# Patient Record
Sex: Female | Born: 1960 | Race: Black or African American | Hispanic: No | Marital: Married | Smoking: Never smoker
Health system: Southern US, Community
[De-identification: ages and names within clinical notes are randomized; demographics above are authoritative.]

## PROBLEM LIST (undated history)

## (undated) DIAGNOSIS — T7840XA Allergy, unspecified, initial encounter: Secondary | ICD-10-CM

## (undated) HISTORY — DX: Allergy, unspecified, initial encounter: T78.40XA

---

## 1998-05-13 ENCOUNTER — Ambulatory Visit (HOSPITAL_COMMUNITY): Admission: RE | Admit: 1998-05-13 | Discharge: 1998-05-13 | Payer: Self-pay | Admitting: *Deleted

## 1999-12-10 ENCOUNTER — Other Ambulatory Visit: Admission: RE | Admit: 1999-12-10 | Discharge: 1999-12-10 | Payer: Self-pay | Admitting: Family Medicine

## 1999-12-24 ENCOUNTER — Encounter: Payer: Self-pay | Admitting: Family Medicine

## 1999-12-24 ENCOUNTER — Ambulatory Visit (HOSPITAL_COMMUNITY): Admission: RE | Admit: 1999-12-24 | Discharge: 1999-12-24 | Payer: Self-pay | Admitting: *Deleted

## 2001-05-17 ENCOUNTER — Other Ambulatory Visit: Admission: RE | Admit: 2001-05-17 | Discharge: 2001-05-17 | Payer: Self-pay | Admitting: Family Medicine

## 2002-06-28 ENCOUNTER — Encounter: Admission: RE | Admit: 2002-06-28 | Discharge: 2002-06-28 | Payer: Self-pay | Admitting: Family Medicine

## 2002-06-28 ENCOUNTER — Encounter: Payer: Self-pay | Admitting: Family Medicine

## 2002-07-11 ENCOUNTER — Other Ambulatory Visit: Admission: RE | Admit: 2002-07-11 | Discharge: 2002-07-11 | Payer: Self-pay | Admitting: Family Medicine

## 2004-01-06 ENCOUNTER — Other Ambulatory Visit: Admission: RE | Admit: 2004-01-06 | Discharge: 2004-01-06 | Payer: Self-pay | Admitting: *Deleted

## 2004-11-16 ENCOUNTER — Encounter: Admission: RE | Admit: 2004-11-16 | Discharge: 2004-11-16 | Payer: Self-pay | Admitting: Family Medicine

## 2005-02-25 ENCOUNTER — Other Ambulatory Visit: Admission: RE | Admit: 2005-02-25 | Discharge: 2005-02-25 | Payer: Self-pay | Admitting: Obstetrics and Gynecology

## 2006-11-08 ENCOUNTER — Ambulatory Visit (HOSPITAL_COMMUNITY): Admission: RE | Admit: 2006-11-08 | Discharge: 2006-11-08 | Payer: Self-pay | Admitting: Obstetrics and Gynecology

## 2006-12-03 ENCOUNTER — Emergency Department (HOSPITAL_COMMUNITY): Admission: EM | Admit: 2006-12-03 | Discharge: 2006-12-04 | Payer: Self-pay | Admitting: Emergency Medicine

## 2013-01-15 ENCOUNTER — Emergency Department: Payer: Self-pay | Admitting: Emergency Medicine

## 2014-09-24 ENCOUNTER — Ambulatory Visit
Admission: RE | Admit: 2014-09-24 | Discharge: 2014-09-24 | Disposition: A | Discharge status: Home / Self Care | Payer: BC Managed Care – PPO | Source: Ambulatory Visit | Attending: Physician Assistant | Admitting: Physician Assistant

## 2014-09-24 ENCOUNTER — Other Ambulatory Visit: Payer: Self-pay | Admitting: Physician Assistant

## 2014-09-24 DIAGNOSIS — R05 Cough: Secondary | ICD-10-CM

## 2014-09-24 DIAGNOSIS — R059 Cough, unspecified: Secondary | ICD-10-CM

## 2015-05-18 ENCOUNTER — Other Ambulatory Visit: Payer: Self-pay | Admitting: Obstetrics and Gynecology

## 2015-05-19 LAB — CYTOLOGY - PAP

## 2015-12-14 ENCOUNTER — Ambulatory Visit: Payer: BLUE CROSS/BLUE SHIELD | Attending: Gynecologic Oncology | Admitting: Gynecologic Oncology

## 2015-12-14 ENCOUNTER — Encounter: Payer: Self-pay | Admitting: Gynecologic Oncology

## 2015-12-14 VITALS — BP 123/64 | HR 79 | Temp 98.9°F | Resp 18 | Ht 66.0 in | Wt 200.4 lb

## 2015-12-14 DIAGNOSIS — Z6832 Body mass index (BMI) 32.0-32.9, adult: Secondary | ICD-10-CM | POA: Insufficient documentation

## 2015-12-14 DIAGNOSIS — Z8041 Family history of malignant neoplasm of ovary: Secondary | ICD-10-CM

## 2015-12-14 DIAGNOSIS — E669 Obesity, unspecified: Secondary | ICD-10-CM | POA: Insufficient documentation

## 2015-12-14 DIAGNOSIS — R971 Elevated cancer antigen 125 [CA 125]: Secondary | ICD-10-CM | POA: Diagnosis present

## 2015-12-14 DIAGNOSIS — D259 Leiomyoma of uterus, unspecified: Secondary | ICD-10-CM

## 2015-12-14 NOTE — Progress Notes (Signed)
Consult Note: Gyn-Onc  Consult was requested by Dr. Radene Knee for the evaluation of Emma Mccormick 55 y.o. female  CC:  Chief Complaint  Patient presents with  . elevated CA 125    Assessment/Plan:  Ms. Emma Mccormick  is a 55 y.o.  year old with a family history (maternal aunt) of ovarian cancer, negative BRCA testing and a slightly elevated CA 125.  I discussed with the patient that given her normal US findings, I am reassured this is unlikely to be malignancy. It may be slightly elevated secondary to fibroids.  I am recommending she have her CA 125 rechecked in the same lab approximately 1 month after the last assessment. If there is a consistent upward trend, we would consider evaluating with laparoscopy. However, this patient has additional surgical risks (obesity, BMI 32, prior surgeries) and I discussed the risks of surgical castration prior to age 8 (increased all-cause mortality). Therefore, we should exhaust a more conservative work-up prior to pursuing surgery.  The patient feels comfortable with this plan as she states she "doesnt want surgery".  I discussed that she is at average risk for ovarian cancer (1 in 17) and therefore, once her CA 125 level is established to be either stable or normal, she should not be a recipient of ongoing screening US or CA 125 assessments.  HPI: Emma Mccormick is a 55 year old P2 who is seen in consultation at the request of Dr Radene Knee for an elevated CA 125 tumor marker. This is in the setting of fibroid uterus and a family history (maternal aunt) with ovarian cancer. She has no history for breast cancer. Her BRCA testing from January 2017 was negative for deleterious mutations.  Due to her concern a CA 125 level was drawn on 11/19/15 and was slightly elevated at 38U/mL. A transvaginal US was ordered the same day which revealed a 12.6x7.6x9.4cm uterus. The right ovary was grossly normal in size with a sub-cm simple cyst. The left ovary was not seen  secondary to fibroids and body habitus. The uterus contained several intramural fibroids with the largest measuring 7cm.  She has had 2 cesarean sections.  Current Meds:  No outpatient encounter prescriptions on file as of 12/14/2015.   No facility-administered encounter medications on file as of 12/14/2015.    Allergy:  Allergies  Allergen Reactions  . Sulfa Antibiotics Hives    Social Hx:   Social History   Social History  . Marital Status: Married    Spouse Name: N/A  . Number of Children: N/A  . Years of Education: N/A   Occupational History  . Not on file.   Social History Main Topics  . Smoking status: Never Smoker   . Smokeless tobacco: Not on file  . Alcohol Use: No  . Drug Use: No  . Sexual Activity: Not on file   Other Topics Concern  . Not on file   Social History Narrative  . No narrative on file    Past Surgical Hx:  Past Surgical History  Procedure Laterality Date  . Cesarean section  1981 and 1994    Past Medical Hx:  Past Medical History  Diagnosis Date  . Allergy     Past Gynecological History:  C/s x 2 No LMP recorded.  Family Hx:  Family History  Problem Relation Age of Onset  . Diabetes Mother   . Hypertension Mother   . Cancer Sister   . Cancer Brother   . Cancer Maternal Aunt  Review of Systems:  Constitutional  Feels well,    ENT Normal appearing ears and nares bilaterally Skin/Breast  No rash, sores, jaundice, itching, dryness Cardiovascular  No chest pain, shortness of breath, or edema  Pulmonary  No cough or wheeze.  Gastro Intestinal  No nausea, vomitting, or diarrhoea. No bright red blood per rectum, no abdominal pain, change in bowel movement, or constipation.  Genito Urinary  No frequency, urgency, dysuria, + vaginal spotting Musculo Skeletal  No myalgia, arthralgia, joint swelling or pain  Neurologic  No weakness, numbness, change in gait,  Psychology  No depression, anxiety, insomnia.   Vitals:   Blood pressure 123/64, pulse 79, temperature 98.9 F (37.2 C), temperature source Oral, resp. rate 18, height 5' 6"  (1.676 m), weight 200 lb 6.4 oz (90.901 kg), SpO2 100 %.  Physical Exam: WD in NAD Neck  Supple NROM, without any enlargements.  Lymph Node Survey No cervical supraclavicular or inguinal adenopathy Cardiovascular  Pulse normal rate, regularity and rhythm. S1 and S2 normal.  Lungs  Clear to auscultation bilateraly, without wheezes/crackles/rhonchi. Good air movement.  Skin  No rash/lesions/breakdown  Psychiatry  Alert and oriented to person, place, and time  Abdomen  Normoactive bowel sounds, abdomen soft, non-tender and obese without evidence of hernia.  Back No CVA tenderness Genito Urinary  Vulva/vagina: Normal external female genitalia.  No lesions. No discharge or bleeding.  Bladder/urethra:  No lesions or masses, well supported bladder  Vagina: normal  Cervix: Normal appearing, no lesions.  Uterus: Bulky, 12cm. mobile, no parametrial involvement or nodularity.  Adnexa: no palpable masses. Rectal  deferred  Extremities  No bilateral cyanosis, clubbing or edema.   Donaciano Eva, MD  12/14/2015, 1:07 PM

## 2015-12-14 NOTE — Patient Instructions (Signed)
Plan to have a repeat CA 125 draw with Dr. Sherran Needs office.  Please call for any questions or concerns.

## 2016-01-11 ENCOUNTER — Encounter: Payer: Self-pay | Admitting: Genetic Counselor

## 2016-01-14 ENCOUNTER — Telehealth: Payer: Self-pay | Admitting: Gynecologic Oncology

## 2016-01-14 NOTE — Telephone Encounter (Signed)
Left message to call back.  Her CA 125 is stable (37 - had been 38).  No evidence for occult malignancy.  Mild elevation in CA 125 likely secondary to fibroids.  Plan is for no intervention (including no surgery). She does not require any more screening checks of CA 125 or ultrasounds.  Donaciano Eva, MD

## 2016-02-08 ENCOUNTER — Encounter: Payer: BLUE CROSS/BLUE SHIELD | Admitting: Genetic Counselor

## 2016-02-08 ENCOUNTER — Other Ambulatory Visit: Payer: BLUE CROSS/BLUE SHIELD

## 2016-02-25 DIAGNOSIS — M25511 Pain in right shoulder: Secondary | ICD-10-CM | POA: Diagnosis not present

## 2016-02-26 ENCOUNTER — Other Ambulatory Visit: Payer: Self-pay | Admitting: Family Medicine

## 2016-02-26 ENCOUNTER — Ambulatory Visit
Admission: RE | Admit: 2016-02-26 | Discharge: 2016-02-26 | Disposition: A | Discharge status: Home / Self Care | Payer: BLUE CROSS/BLUE SHIELD | Source: Ambulatory Visit | Attending: Family Medicine | Admitting: Family Medicine

## 2016-02-26 DIAGNOSIS — R52 Pain, unspecified: Secondary | ICD-10-CM

## 2016-02-26 DIAGNOSIS — M25511 Pain in right shoulder: Secondary | ICD-10-CM | POA: Diagnosis not present

## 2016-06-08 DIAGNOSIS — Z6838 Body mass index (BMI) 38.0-38.9, adult: Secondary | ICD-10-CM | POA: Diagnosis not present

## 2016-06-08 DIAGNOSIS — Z1329 Encounter for screening for other suspected endocrine disorder: Secondary | ICD-10-CM | POA: Diagnosis not present

## 2016-06-08 DIAGNOSIS — Z780 Asymptomatic menopausal state: Secondary | ICD-10-CM | POA: Diagnosis not present

## 2016-06-08 DIAGNOSIS — Z13220 Encounter for screening for lipoid disorders: Secondary | ICD-10-CM | POA: Diagnosis not present

## 2016-06-08 DIAGNOSIS — Z01419 Encounter for gynecological examination (general) (routine) without abnormal findings: Secondary | ICD-10-CM | POA: Diagnosis not present

## 2016-06-08 DIAGNOSIS — Z13228 Encounter for screening for other metabolic disorders: Secondary | ICD-10-CM | POA: Diagnosis not present

## 2016-06-08 DIAGNOSIS — Z1231 Encounter for screening mammogram for malignant neoplasm of breast: Secondary | ICD-10-CM | POA: Diagnosis not present

## 2016-07-20 DIAGNOSIS — E559 Vitamin D deficiency, unspecified: Secondary | ICD-10-CM | POA: Diagnosis not present

## 2016-11-11 DIAGNOSIS — J069 Acute upper respiratory infection, unspecified: Secondary | ICD-10-CM | POA: Diagnosis not present

## 2016-11-15 DIAGNOSIS — J069 Acute upper respiratory infection, unspecified: Secondary | ICD-10-CM | POA: Diagnosis not present

## 2016-12-12 DIAGNOSIS — H43811 Vitreous degeneration, right eye: Secondary | ICD-10-CM | POA: Diagnosis not present

## 2016-12-12 DIAGNOSIS — H43391 Other vitreous opacities, right eye: Secondary | ICD-10-CM | POA: Diagnosis not present

## 2016-12-14 DIAGNOSIS — R8290 Unspecified abnormal findings in urine: Secondary | ICD-10-CM | POA: Diagnosis not present

## 2016-12-30 IMAGING — CR DG SHOULDER 2+V*R*
3 series · 3 of 3 positions shown · non-contrast
Comparison: None.

CLINICAL DATA: Right shoulder pain for 6 months

EXAM:
RIGHT SHOULDER - 2+ VIEW

[w shoulder ap internal righ *]
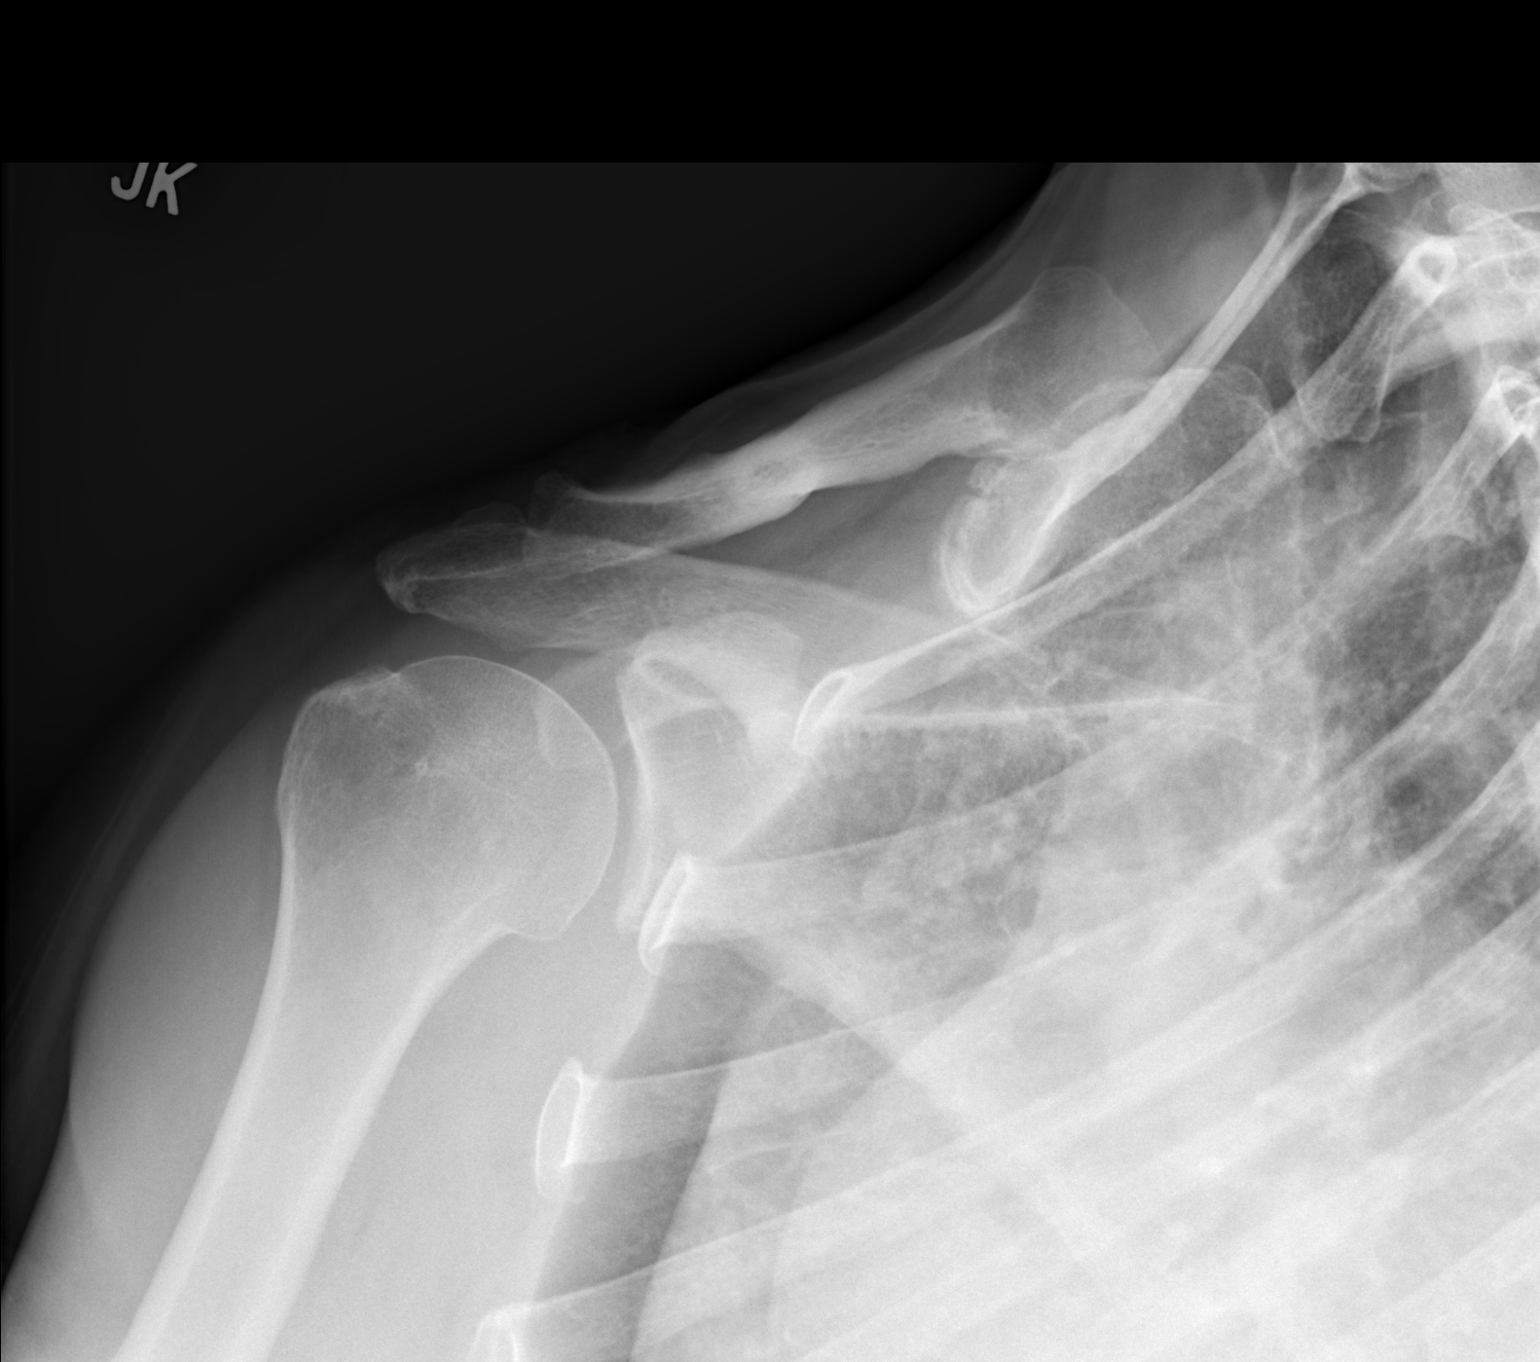

[w shoulder ap external righ]
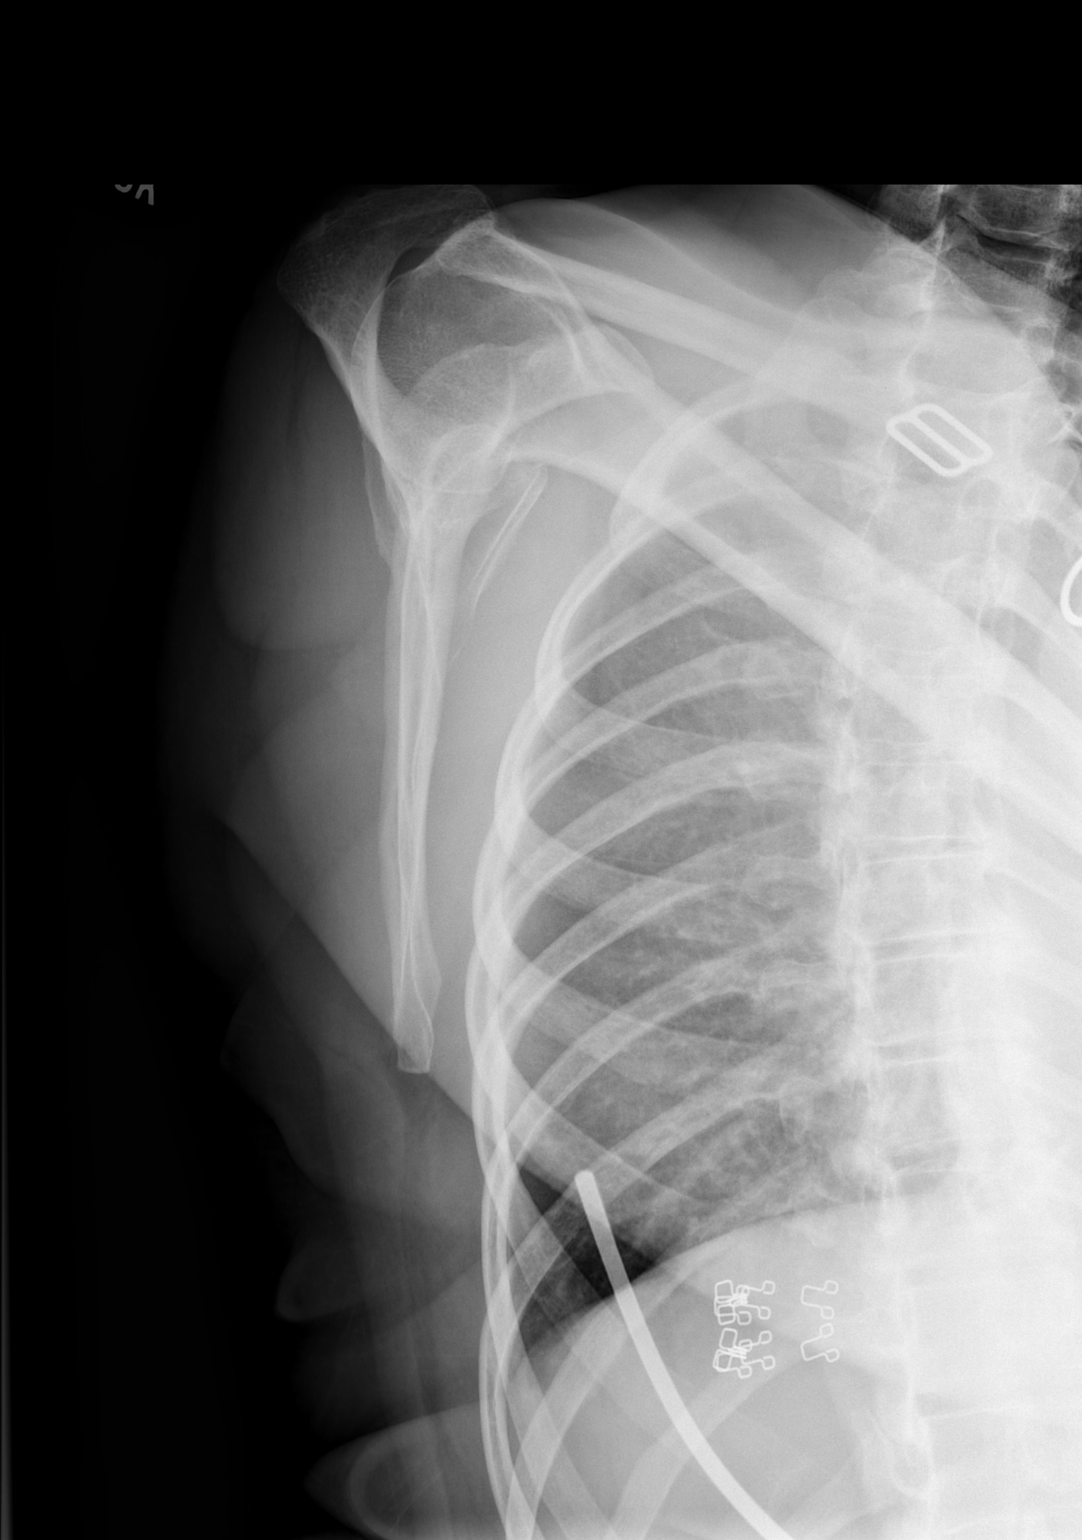

[w shoulder y view right]
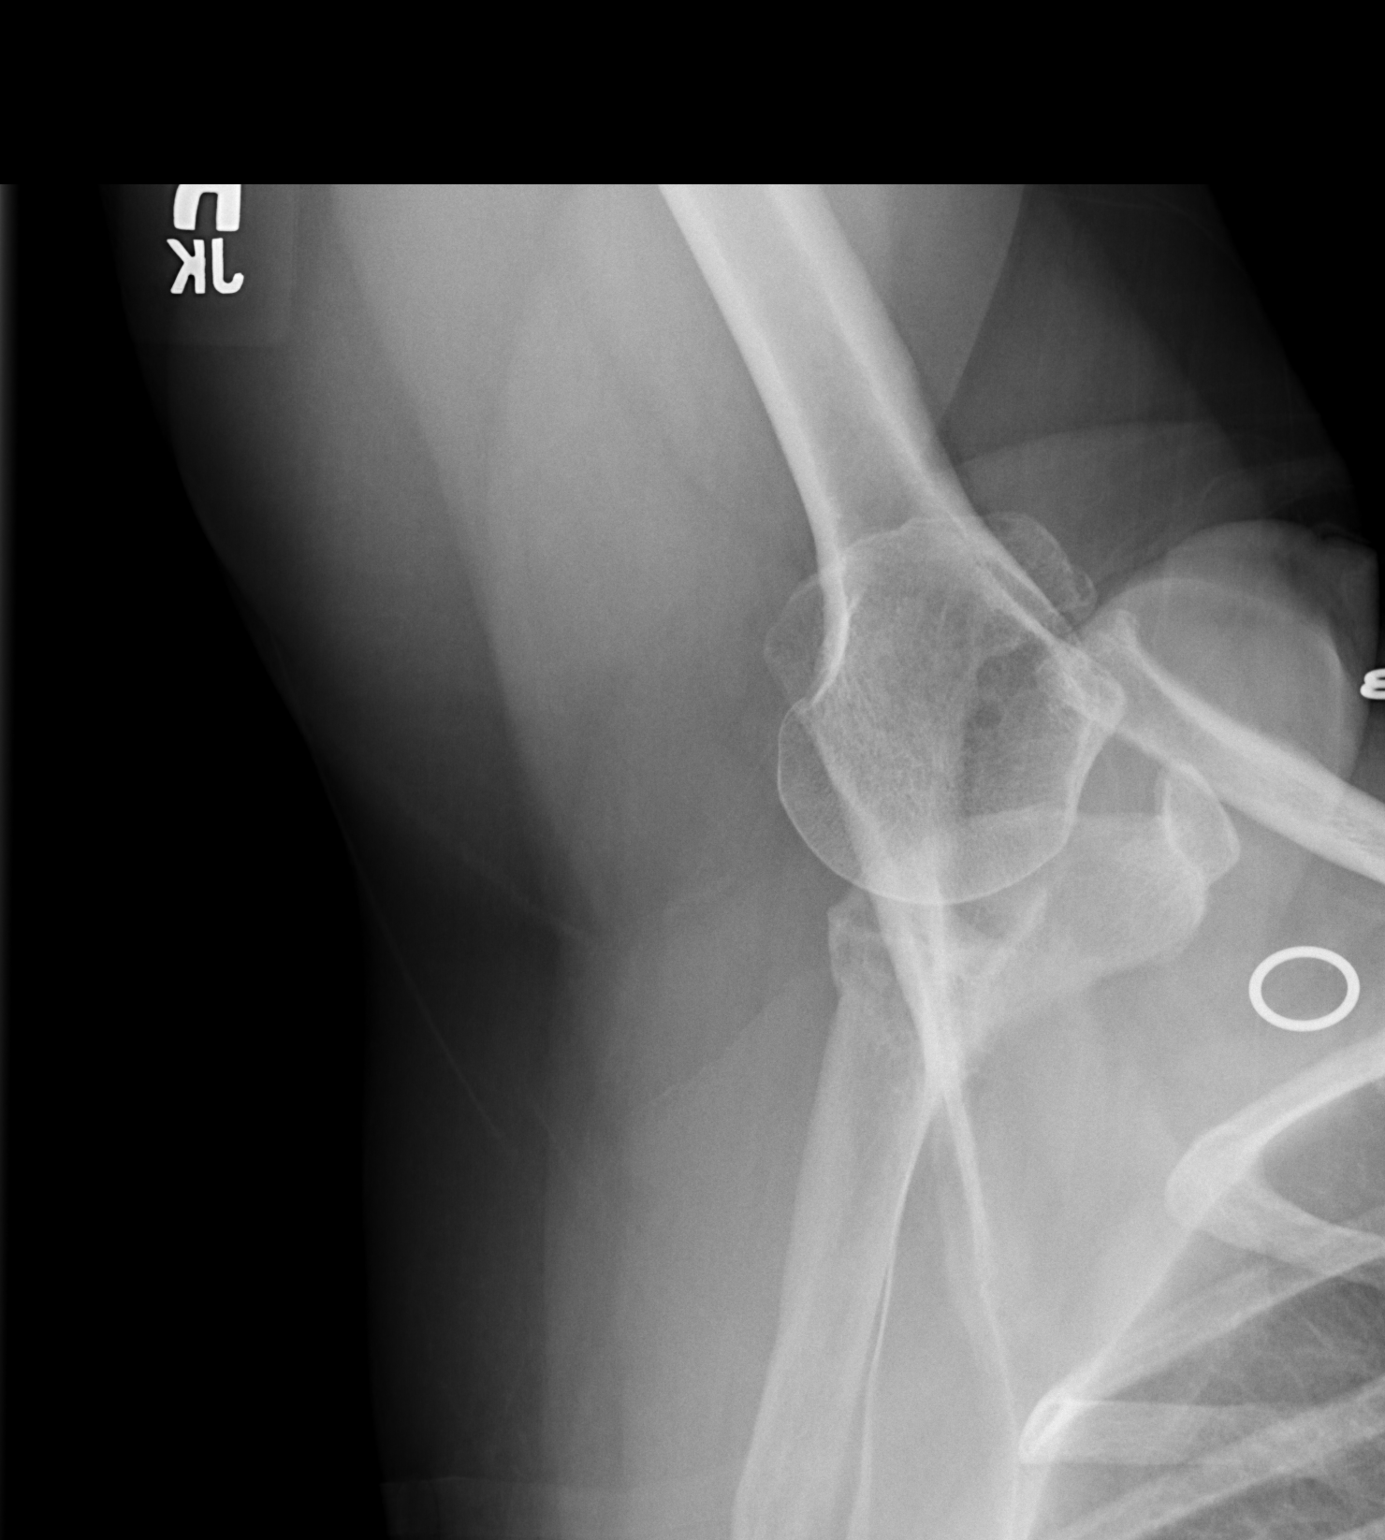

[3 of 3 positions shown; findings below may reference images not displayed]

FINDINGS: Anatomic alignment.  No fracture or dislocation.
IMPRESSION: No acute bony pathology.

## 2017-02-14 DIAGNOSIS — H43811 Vitreous degeneration, right eye: Secondary | ICD-10-CM | POA: Diagnosis not present

## 2017-02-14 DIAGNOSIS — H43391 Other vitreous opacities, right eye: Secondary | ICD-10-CM | POA: Diagnosis not present

## 2017-04-17 DIAGNOSIS — M25572 Pain in left ankle and joints of left foot: Secondary | ICD-10-CM | POA: Diagnosis not present

## 2017-04-28 DIAGNOSIS — S93409A Sprain of unspecified ligament of unspecified ankle, initial encounter: Secondary | ICD-10-CM | POA: Diagnosis not present

## 2017-05-26 DIAGNOSIS — M25572 Pain in left ankle and joints of left foot: Secondary | ICD-10-CM | POA: Diagnosis not present

## 2017-06-12 DIAGNOSIS — Z6836 Body mass index (BMI) 36.0-36.9, adult: Secondary | ICD-10-CM | POA: Diagnosis not present

## 2017-06-12 DIAGNOSIS — Z01419 Encounter for gynecological examination (general) (routine) without abnormal findings: Secondary | ICD-10-CM | POA: Diagnosis not present

## 2017-06-12 DIAGNOSIS — E559 Vitamin D deficiency, unspecified: Secondary | ICD-10-CM | POA: Diagnosis not present

## 2017-06-12 DIAGNOSIS — Z1231 Encounter for screening mammogram for malignant neoplasm of breast: Secondary | ICD-10-CM | POA: Diagnosis not present

## 2017-06-16 DIAGNOSIS — S93402A Sprain of unspecified ligament of left ankle, initial encounter: Secondary | ICD-10-CM | POA: Diagnosis not present

## 2017-07-25 DIAGNOSIS — E559 Vitamin D deficiency, unspecified: Secondary | ICD-10-CM | POA: Diagnosis not present

## 2017-09-07 DIAGNOSIS — G4750 Parasomnia, unspecified: Secondary | ICD-10-CM | POA: Diagnosis not present

## 2017-09-07 DIAGNOSIS — G2581 Restless legs syndrome: Secondary | ICD-10-CM | POA: Diagnosis not present

## 2017-09-07 DIAGNOSIS — G4710 Hypersomnia, unspecified: Secondary | ICD-10-CM | POA: Diagnosis not present

## 2017-09-07 DIAGNOSIS — R0602 Shortness of breath: Secondary | ICD-10-CM | POA: Diagnosis not present

## 2017-09-15 DIAGNOSIS — G4730 Sleep apnea, unspecified: Secondary | ICD-10-CM | POA: Diagnosis not present

## 2017-09-21 DIAGNOSIS — G2581 Restless legs syndrome: Secondary | ICD-10-CM | POA: Diagnosis not present

## 2017-09-21 DIAGNOSIS — R0683 Snoring: Secondary | ICD-10-CM | POA: Diagnosis not present

## 2017-11-08 DIAGNOSIS — M546 Pain in thoracic spine: Secondary | ICD-10-CM | POA: Diagnosis not present

## 2017-11-28 DIAGNOSIS — N898 Other specified noninflammatory disorders of vagina: Secondary | ICD-10-CM | POA: Diagnosis not present

## 2018-06-14 DIAGNOSIS — E559 Vitamin D deficiency, unspecified: Secondary | ICD-10-CM | POA: Diagnosis not present

## 2018-06-14 DIAGNOSIS — Z8041 Family history of malignant neoplasm of ovary: Secondary | ICD-10-CM | POA: Diagnosis not present

## 2018-06-14 DIAGNOSIS — D259 Leiomyoma of uterus, unspecified: Secondary | ICD-10-CM | POA: Diagnosis not present

## 2018-06-14 DIAGNOSIS — R7982 Elevated C-reactive protein (CRP): Secondary | ICD-10-CM | POA: Diagnosis not present

## 2018-06-14 DIAGNOSIS — Z1231 Encounter for screening mammogram for malignant neoplasm of breast: Secondary | ICD-10-CM | POA: Diagnosis not present

## 2018-06-14 DIAGNOSIS — Z6833 Body mass index (BMI) 33.0-33.9, adult: Secondary | ICD-10-CM | POA: Diagnosis not present

## 2018-06-14 DIAGNOSIS — Z01419 Encounter for gynecological examination (general) (routine) without abnormal findings: Secondary | ICD-10-CM | POA: Diagnosis not present

## 2018-06-14 DIAGNOSIS — N951 Menopausal and female climacteric states: Secondary | ICD-10-CM | POA: Diagnosis not present

## 2018-06-14 DIAGNOSIS — R5383 Other fatigue: Secondary | ICD-10-CM | POA: Diagnosis not present

## 2018-06-20 DIAGNOSIS — R971 Elevated cancer antigen 125 [CA 125]: Secondary | ICD-10-CM | POA: Diagnosis not present

## 2018-06-20 DIAGNOSIS — D259 Leiomyoma of uterus, unspecified: Secondary | ICD-10-CM | POA: Diagnosis not present

## 2018-11-30 DIAGNOSIS — J069 Acute upper respiratory infection, unspecified: Secondary | ICD-10-CM | POA: Diagnosis not present

## 2018-11-30 DIAGNOSIS — R52 Pain, unspecified: Secondary | ICD-10-CM | POA: Diagnosis not present

## 2020-08-18 DIAGNOSIS — Z6834 Body mass index (BMI) 34.0-34.9, adult: Secondary | ICD-10-CM | POA: Diagnosis not present

## 2020-08-18 DIAGNOSIS — Z13228 Encounter for screening for other metabolic disorders: Secondary | ICD-10-CM | POA: Diagnosis not present

## 2020-08-18 DIAGNOSIS — Z13820 Encounter for screening for osteoporosis: Secondary | ICD-10-CM | POA: Diagnosis not present

## 2020-08-18 DIAGNOSIS — Z1329 Encounter for screening for other suspected endocrine disorder: Secondary | ICD-10-CM | POA: Diagnosis not present

## 2020-08-18 DIAGNOSIS — Z13220 Encounter for screening for lipoid disorders: Secondary | ICD-10-CM | POA: Diagnosis not present

## 2020-08-18 DIAGNOSIS — Z01419 Encounter for gynecological examination (general) (routine) without abnormal findings: Secondary | ICD-10-CM | POA: Diagnosis not present

## 2020-08-18 DIAGNOSIS — Z1231 Encounter for screening mammogram for malignant neoplasm of breast: Secondary | ICD-10-CM | POA: Diagnosis not present

## 2021-03-05 DIAGNOSIS — M65312 Trigger thumb, left thumb: Secondary | ICD-10-CM | POA: Diagnosis not present

## 2021-05-13 DIAGNOSIS — R0781 Pleurodynia: Secondary | ICD-10-CM | POA: Diagnosis not present

## 2021-05-13 DIAGNOSIS — T148XXA Other injury of unspecified body region, initial encounter: Secondary | ICD-10-CM | POA: Diagnosis not present

## 2021-11-17 DIAGNOSIS — Z6836 Body mass index (BMI) 36.0-36.9, adult: Secondary | ICD-10-CM | POA: Diagnosis not present

## 2021-11-17 DIAGNOSIS — Z1231 Encounter for screening mammogram for malignant neoplasm of breast: Secondary | ICD-10-CM | POA: Diagnosis not present

## 2021-11-17 DIAGNOSIS — Z01419 Encounter for gynecological examination (general) (routine) without abnormal findings: Secondary | ICD-10-CM | POA: Diagnosis not present

## 2021-12-01 DIAGNOSIS — Z8041 Family history of malignant neoplasm of ovary: Secondary | ICD-10-CM | POA: Diagnosis not present

## 2021-12-01 DIAGNOSIS — Z803 Family history of malignant neoplasm of breast: Secondary | ICD-10-CM | POA: Diagnosis not present

## 2021-12-01 DIAGNOSIS — D251 Intramural leiomyoma of uterus: Secondary | ICD-10-CM | POA: Diagnosis not present

## 2021-12-04 DIAGNOSIS — S62646B Nondisplaced fracture of proximal phalanx of right little finger, initial encounter for open fracture: Secondary | ICD-10-CM | POA: Diagnosis not present

## 2021-12-04 DIAGNOSIS — S62616A Displaced fracture of proximal phalanx of right little finger, initial encounter for closed fracture: Secondary | ICD-10-CM | POA: Diagnosis not present

## 2021-12-24 DIAGNOSIS — S62646B Nondisplaced fracture of proximal phalanx of right little finger, initial encounter for open fracture: Secondary | ICD-10-CM | POA: Diagnosis not present

## 2024-08-21 NOTE — Progress Notes (Signed)
 Cardiology Office Note   Date:  09/02/2024  ID:  Emma Mccormick, Gunnoe 03/19/1961, MRN [id number] PCP: Alvera Reagin, PA  [facility] Providers Cardiologist:  None    History of Present Illness Emma Mccormick is a 63 y.o. female with a past medical history of chronic back pain, history of colon polyps.  Presents today for evaluation of new heart murmur  Patient been seen by her PCP on 08/19/2024.  Found to have a heart murmur.  Heart murmur had not previously been documented by primary care provider.  Was referred to cardiology  Today, patient presents to establish care with cardiology.  She tells me that she had been seen by [facility] for evaluation of cough.  Was told that she had a cardiac murmur.  She has never been told this before.  She denies having any personal medical history.  She has a family history of diabetes and hypertension, no family cardiac history.  Follows with her OB/GYN and primary care provider through [facility].  Tells me that her A1c and cholesterol are regularly checked through them.  She denies chest pain.  Denies shortness of breath.  Denies lower extremity swelling.  She has gained about 15-20 pounds over the past year.  This weight has been gradual and she denies rapid weight fluctuations.  She has had a dry cough for a few weeks.  Denies URI symptoms.  She does not have significant palpitations.  Has rare episodes of lightheadedness, no syncope or near syncope   Studies Reviewed EKG Interpretation Date/Time:  Monday September 02 2024 08:46:58 EST Ventricular Rate:  66 PR Interval:  158 QRS Duration:  84 QT Interval:  398 QTC Calculation: 417 R Axis:   44  Text Interpretation: Normal sinus rhythm Normal ECG No previous ECGs available Confirmed by Vicci Sauer [id number]) on 09/02/2024 8:50:32 AM    Risk Assessment/Calculations           Physical Exam VS:  BP 136/86   Pulse 66   Ht 5' 2 (1.575 m)   Wt 208 lb (94.3 kg)   SpO2 98%   BMI  38.04 kg/m        Wt Readings from Last 3 Encounters:  09/02/24 208 lb (94.3 kg)  12/14/15 200 lb 6.4 oz (90.9 kg)    GEN: Well nourished, well developed in no acute distress.  Sitting comfortably on the exam table NECK: No JVD  CARDIAC:  RRR.  Grade 1/6 systolic murmur at right upper sternal border RESPIRATORY:  Clear to auscultation without rales, wheezing or rhonchi.  Normal breathing on room air ABDOMEN: Soft, non-tender, non-distended EXTREMITIES:  No edema in BLE; No deformity   ASSESSMENT AND PLAN  Cardiac murmur -Patient has grade 1/6 systolic murmur at right upper sternal border.  This was recently identified by PCP, had never been mentioned to her before then  - No chest pain, shortness of breath, syncope/near syncope.  No lower extremity swelling or weight fluctuations -Ordered echocardiogram   Elevated BP without diagnosis of hypertension - Initial BP was elevated to 150/90.  On recheck, had improved to 136/86.  Patient tells me that she was very stressed about making her appointment this morning and felt very rushed on her way in. - BP was 132/80 at appointment with PCP on 10/27 - Discussed starting low-dose amlodipine versus BP monitoring at home.  Patient does have blood pressure cuff at home and is able to check her BP.  She would prefer to continue  to monitor - Discussed low-sodium diet, provided written information on AVS -Encourage patient to increase physical activity as tolerated    Dispo: Follow-up pending echocardiogram results  Signed, Rollo FABIENE Louder, PA-C

## 2024-09-02 ENCOUNTER — Encounter: Payer: Self-pay | Admitting: Cardiology

## 2024-09-02 ENCOUNTER — Ambulatory Visit: Attending: Cardiology | Admitting: Cardiology

## 2024-09-02 VITALS — BP 136/86 | HR 66 | Ht 62.0 in | Wt 208.0 lb

## 2024-09-02 DIAGNOSIS — R030 Elevated blood-pressure reading, without diagnosis of hypertension: Secondary | ICD-10-CM

## 2024-09-02 DIAGNOSIS — R011 Cardiac murmur, unspecified: Secondary | ICD-10-CM | POA: Diagnosis not present

## 2024-09-02 NOTE — Patient Instructions (Addendum)
 Medication Instructions:  Your physician recommends that you continue on your current medications as directed. Please refer to the Current Medication list given to you today.  *If you need a refill on your cardiac medications before your next appointment, please call your pharmacy*  Lab Work: None ordered  If you have labs (blood work) drawn today and your tests are completely normal, you will receive your results only by: MyChart Message (if you have MyChart) OR A paper copy in the mail If you have any lab test that is abnormal or we need to change your treatment, we will call you to review the results.  Testing/Procedures: Your physician has requested that you have an echocardiogram. Echocardiography is a painless test that uses sound waves to create images of your heart. It provides your doctor with information about the size and shape of your heart and how well your heart's chambers and valves are working. This procedure takes approximately one hour. There are no restrictions for this procedure. Please do NOT wear cologne, perfume, aftershave, or lotions (deodorant is allowed). Please arrive 15 minutes prior to your appointment time.  Please note: We ask at that you not bring children with you during ultrasound (echo/ vascular) testing. Due to room size and safety concerns, children are not allowed in the ultrasound rooms during exams. Our front office staff cannot provide observation of children in our lobby area while testing is being conducted. An adult accompanying a patient to their appointment will only be allowed in the ultrasound room at the discretion of the ultrasound technician under special circumstances. We apologize for any inconvenience.   Follow-Up: At [facility], you and your health needs are our priority.  As part of our continuing mission to provide you with exceptional heart care, our providers are all part of one team.  This team includes your primary  Cardiologist (physician) and Advanced Practice Providers or APPs (Physician Assistants and Nurse Practitioners) who all work together to provide you with the care you need, when you need it.  Your next appointment:   Depending upon test results  Provider:   Rollo Louder, PA-C          We recommend signing up for the patient portal called MyChart.  Sign up information is provided on this After Visit Summary.  MyChart is used to connect with patients for Virtual Visits (Telemedicine).  Patients are able to view lab/test results, encounter notes, upcoming appointments, etc.  Non-urgent messages can be sent to your provider as well.   To learn more about what you can do with MyChart, go to [email or web address].   Other Instructions Your physician has requested that you regularly monitor and record your blood pressure readings at home. Please use the same machine at the same time of day to check your readings and record them to bring to your follow-up visit.     Please monitor blood pressures and keep a log of your readings.   Your target should be 130/80    Low-Sodium Eating Plan Salt (sodium) helps you keep a healthy balance of fluids in your body. Too much sodium can raise your blood pressure. It can also cause fluid and waste to be held in your body. Your health care provider or dietitian may recommend a low-sodium eating plan if you have high blood pressure (hypertension), kidney disease, liver disease, or heart failure. Eating less sodium can help lower your blood pressure and reduce swelling. It can also protect your heart, liver, and kidneys.  What are tips for following this plan? Reading food labels  Check food labels for the amount of sodium per serving. If you eat more than one serving, you must multiply the listed amount by the number of servings. Choose foods with less than 140 milligrams (mg) of sodium per serving. Avoid foods with 300 mg of sodium or more per  serving. Always check how much sodium is in a product, even if the label says unsalted or no salt added. Shopping  Buy products labeled as low-sodium or no salt added. Buy fresh foods. Avoid canned foods and pre-made or frozen meals. Avoid canned, cured, or processed meats. Buy breads that have less than 80 mg of sodium per slice. Cooking  Eat more home-cooked food. Try to eat less restaurant, buffet, and fast food. Try not to add salt when you cook. Use salt-free seasonings or herbs instead of table salt or sea salt. Check with your provider or pharmacist before using salt substitutes. Cook with plant-based oils, such as canola, sunflower, or olive oil. Meal planning When eating at a restaurant, ask if your food can be made with less salt or no salt. Avoid dishes labeled as brined, pickled, cured, or smoked. Avoid dishes made with soy sauce, miso, or teriyaki sauce. Avoid foods that have monosodium glutamate (MSG) in them. MSG may be added to some restaurant food, sauces, soups, bouillon, and canned foods. Make meals that can be grilled, baked, poached, roasted, or steamed. These are often made with less sodium. General information Try to limit your sodium intake to 1,500-2,300 mg each day, or the amount told by your provider. What foods should I eat? Fruits Fresh, frozen, or canned fruit. Fruit juice. Vegetables Fresh or frozen vegetables. No salt added canned vegetables. No salt added tomato sauce and paste. Low-sodium or reduced-sodium tomato and vegetable juice. Grains Low-sodium cereals, such as oats, puffed wheat and rice, and shredded wheat. Low-sodium crackers. Unsalted rice. Unsalted pasta. Low-sodium bread. Whole grain breads and whole grain pasta. Meats and other proteins Fresh or frozen meat, poultry, seafood, and fish. These should have no added salt. Low-sodium canned tuna and salmon. Unsalted nuts. Dried peas, beans, and lentils without added salt. Unsalted  canned beans. Eggs. Unsalted nut butters. Dairy Milk. Soy milk. Cheese that is naturally low in sodium, such as ricotta cheese, fresh mozzarella, or Swiss cheese. Low-sodium or reduced-sodium cheese. Cream cheese. Yogurt. Seasonings and condiments Fresh and dried herbs and spices. Salt-free seasonings. Low-sodium mustard and ketchup. Sodium-free salad dressing. Sodium-free light mayonnaise. Fresh or refrigerated horseradish. Lemon juice. Vinegar. Other foods Homemade, reduced-sodium, or low-sodium soups. Unsalted popcorn and pretzels. Low-salt or salt-free chips. The items listed above may not be all the foods and drinks you can have. Talk to a dietitian to learn more. What foods should I avoid? Vegetables Sauerkraut, pickled vegetables, and relishes. Olives. French fries. Onion rings. Regular canned vegetables, except low-sodium or reduced-sodium items. Regular canned tomato sauce and paste. Regular tomato and vegetable juice. Frozen vegetables in sauces. Grains Instant hot cereals. Bread stuffing, pancake, and biscuit mixes. Croutons. Seasoned rice or pasta mixes. Noodle soup cups. Boxed or frozen macaroni and cheese. Regular salted crackers. Self-rising flour. Meats and other proteins Meat or fish that is salted, canned, smoked, spiced, or pickled. Precooked or cured meat, such as sausages or meat loaves. [place]. Ham. Pepperoni. Hot dogs. Corned beef. Chipped beef. Salt pork. Jerky. Pickled herring, anchovies, and sardines. Regular canned tuna. Salted nuts. Dairy Processed cheese and cheese spreads. Hard cheeses.  Cheese curds. Blue cheese. Feta cheese. String cheese. Regular cottage cheese. Buttermilk. Canned milk. Fats and oils Salted butter. Regular margarine. Ghee. Bacon fat. Seasonings and condiments Onion salt, garlic salt, seasoned salt, table salt, and sea salt. Canned and packaged gravies. Worcestershire sauce. Tartar sauce. Barbecue sauce. Teriyaki sauce. Soy sauce, including  reduced-sodium soy sauce. Steak sauce. Fish sauce. Oyster sauce. Cocktail sauce. Horseradish that you find on the shelf. Regular ketchup and mustard. Meat flavorings and tenderizers. Bouillon cubes. Hot sauce. Pre-made or packaged marinades. Pre-made or packaged taco seasonings. Relishes. Regular salad dressings. Salsa. Other foods Salted popcorn and pretzels. Corn chips and puffs. Potato and tortilla chips. Canned or dried soups. Pizza. Frozen entrees and pot pies. The items listed above may not be all the foods and drinks you should avoid. Talk to a dietitian to learn more. This information is not intended to replace advice given to you by your health care provider. Make sure you discuss any questions you have with your health care provider. Document Revised: 10/27/2022 Document Reviewed: 10/27/2022 Elsevier Patient Education  2024 [employer].

## 2024-10-11 ENCOUNTER — Ambulatory Visit (HOSPITAL_COMMUNITY)
Admission: RE | Admit: 2024-10-11 | Discharge: 2024-10-11 | Disposition: A | Discharge status: Home / Self Care | Source: Ambulatory Visit | Attending: Cardiology | Admitting: Cardiology

## 2024-10-11 DIAGNOSIS — R011 Cardiac murmur, unspecified: Secondary | ICD-10-CM

## 2024-10-11 LAB — ECHOCARDIOGRAM COMPLETE
Area-P 1/2: 3.6 cm2
P 1/2 time: 458 ms
S' Lateral: 1.9 cm

## 2024-10-12 ENCOUNTER — Ambulatory Visit: Payer: Self-pay | Admitting: Cardiology
